# Patient Record
Sex: Male | Born: 1995 | Hispanic: Yes | Marital: Single | Smoking: Current every day smoker
Health system: Southern US, Community
[De-identification: ages and names within clinical notes are randomized; demographics above are authoritative.]

---
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
## 2013-05-20 ENCOUNTER — Encounter (HOSPITAL_COMMUNITY): Payer: Self-pay | Admitting: Emergency Medicine

## 2013-05-20 ENCOUNTER — Emergency Department (HOSPITAL_COMMUNITY)
Admission: EM | Admit: 2013-05-20 | Discharge: 2013-05-20 | Disposition: A | Discharge status: Home / Self Care | Payer: Self-pay | Attending: Emergency Medicine | Admitting: Emergency Medicine

## 2013-05-20 DIAGNOSIS — J029 Acute pharyngitis, unspecified: Secondary | ICD-10-CM

## 2013-05-20 DIAGNOSIS — J209 Acute bronchitis, unspecified: Secondary | ICD-10-CM | POA: Insufficient documentation

## 2013-05-20 DIAGNOSIS — H9209 Otalgia, unspecified ear: Secondary | ICD-10-CM | POA: Insufficient documentation

## 2013-05-20 DIAGNOSIS — J40 Bronchitis, not specified as acute or chronic: Secondary | ICD-10-CM

## 2013-05-20 DIAGNOSIS — R197 Diarrhea, unspecified: Secondary | ICD-10-CM | POA: Insufficient documentation

## 2013-05-20 DIAGNOSIS — R1110 Vomiting, unspecified: Secondary | ICD-10-CM | POA: Insufficient documentation

## 2013-05-20 DIAGNOSIS — F17200 Nicotine dependence, unspecified, uncomplicated: Secondary | ICD-10-CM | POA: Insufficient documentation

## 2013-05-20 MED ORDER — AMOXICILLIN 500 MG PO CAPS: 500.0000 mg | ORAL_CAPSULE | Freq: Three times a day (TID) | ORAL | Status: DC

## 2013-05-20 NOTE — ED Notes (Signed)
Pt alert, arrives from home, c/o sore throat, fever, onset was several days ago, resp even unlabored, skin pwd

## 2013-05-20 NOTE — ED Provider Notes (Signed)
CSN: [id number]     Arrival date & time 05/20/13  [id number] History   First MD Initiated Contact with Patient 05/20/13 [phone]     Chief Complaint  Patient presents with  . Sore Throat     (Consider location/radiation/quality/duration/timing/severity/associated sxs/prior Treatment) HPI Patient reports 5 days ago he started having fever and body aches. The following day he started having a sore throat that has persisted. He also reports he is having green rhinorrhea and coughing with green mucus production. He also has pain in his right ear. He reports he had vomiting twice the first day. He had mild diarrhea yesterday. He denies nausea now. He denies feeling dizzy, lightheaded, or weak. His wife reports the whole household has been ill.   PCP none  History reviewed. No pertinent past medical history. History reviewed. No pertinent past surgical history. History reviewed. No pertinent family history. History  Substance Use Topics  . Smoking status: Current Every Day Smoker    Types: Cigarettes  . Smokeless tobacco: Not on file  . 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      Allergies  Review of patient's allergies indicates no known allergies.  Home Medications  none  BP 141/60  Pulse 83  Temp(Src) 98 F (36.7 C) (Oral)  Resp 16  Wt 280 lb (127.007 kg)  SpO2 98%  Vital signs normal   Physical Exam  Nursing note and vitals reviewed. Constitutional: He is oriented to person, place, and time. He appears well-developed and well-nourished.  Non-toxic appearance. He does not appear ill. No distress.  HENT:  Head: Normocephalic and atraumatic.  Right Ear: Hearing, tympanic membrane, external ear and ear canal normal.  Left Ear: Hearing, tympanic membrane, external ear and ear canal normal.  Nose: Nose normal. No mucosal edema or rhinorrhea.  Mouth/Throat: Oropharynx is  clear and moist and mucous membranes are normal. No dental abscesses or uvula swelling.  Eyes: Conjunctivae and EOM are normal. Pupils are equal, round, and reactive to light.  Neck: Normal range of motion and full passive range of motion without pain. Neck supple.  Cardiovascular: Normal rate, regular rhythm and normal heart sounds.  Exam reveals no gallop and no friction rub.   No murmur heard. Pulmonary/Chest: Effort normal and breath sounds normal. No respiratory distress. He has no wheezes. He has no rhonchi. He has no rales. He exhibits no tenderness and no crepitus.  Abdominal: Soft. Normal appearance and bowel sounds are normal. He exhibits no distension. There is no tenderness. There is no rebound and no guarding.  Musculoskeletal: Normal range of motion. He exhibits no edema and no tenderness.  Moves all extremities well.   Neurological: He is alert and oriented to person, place, and time. He has normal strength. No cranial nerve deficit.  Skin: Skin is warm, dry and intact. No rash noted. No erythema. No pallor.  Psychiatric: He has a normal mood and affect. His speech is normal and behavior is normal. His mood appears not anxious.    ED Course  Procedures (including critical care time)    Labs Review Labs Reviewed - No data to display Imaging Review No results found.   EKG Interpretation None      MDM   patient presents with URI symptoms with sore throat for almost a week. He is a smoker, he was treated    Final diagnoses:  Bronchitis  Pharyngitis    Discharge Medication List as of 05/20/2013  9:59 AM    START taking these medications   Details  amoxicillin (AMOXIL) 500 MG capsule Take 1 capsule (500 mg total) by mouth 3 (three) times daily., Starting 05/20/2013, Until Discontinued, Print        Plan discharge   Devoria Albe, MD, Franz Dell, MD 05/20/13 1139

## 2013-05-20 NOTE — Discharge Instructions (Signed)
Drink plenty of fluids. Take ibuprofen 600 mg + acetaminophen 1000 mg every 6 hrs for pain/fever. Take the antibiotic until gone. You can take mucinex-DM OTC for cough. Recheck if you feel worse.

## 2013-05-20 NOTE — Progress Notes (Signed)
[facility] provided pt with a list of primary care resources to help patient establish primary care.  °

## 2015-04-06 ENCOUNTER — Encounter (HOSPITAL_COMMUNITY): Payer: Self-pay | Admitting: *Deleted

## 2015-04-06 ENCOUNTER — Emergency Department (HOSPITAL_COMMUNITY)
Admission: EM | Admit: 2015-04-06 | Discharge: 2015-04-06 | Disposition: A | Discharge status: Home / Self Care | Payer: Self-pay | Attending: Emergency Medicine | Admitting: Emergency Medicine

## 2015-04-06 DIAGNOSIS — R300 Dysuria: Secondary | ICD-10-CM | POA: Insufficient documentation

## 2015-04-06 DIAGNOSIS — S39012A Strain of muscle, fascia and tendon of lower back, initial encounter: Secondary | ICD-10-CM | POA: Insufficient documentation

## 2015-04-06 DIAGNOSIS — F17210 Nicotine dependence, cigarettes, uncomplicated: Secondary | ICD-10-CM | POA: Insufficient documentation

## 2015-04-06 DIAGNOSIS — Y9289 Other specified places as the place of occurrence of the external cause: Secondary | ICD-10-CM | POA: Insufficient documentation

## 2015-04-06 DIAGNOSIS — X58XXXA Exposure to other specified factors, initial encounter: Secondary | ICD-10-CM | POA: Insufficient documentation

## 2015-04-06 DIAGNOSIS — Y9389 Activity, other specified: Secondary | ICD-10-CM | POA: Insufficient documentation

## 2015-04-06 DIAGNOSIS — Y990 Civilian activity done for income or pay: Secondary | ICD-10-CM | POA: Insufficient documentation

## 2015-04-06 LAB — URINALYSIS, ROUTINE W REFLEX MICROSCOPIC
BILIRUBIN URINE: NEGATIVE
GLUCOSE, UA: NEGATIVE mg/dL
HGB URINE DIPSTICK: NEGATIVE
KETONES UR: NEGATIVE mg/dL
Leukocytes, UA: NEGATIVE
Nitrite: NEGATIVE
PH: 6 (ref 5.0–8.0)
Protein, ur: NEGATIVE mg/dL
SPECIFIC GRAVITY, URINE: 1.018 (ref 1.005–1.030)

## 2015-04-06 MED ORDER — IBUPROFEN 600 MG PO TABS: 600.0000 mg | ORAL_TABLET | Freq: Four times a day (QID) | ORAL | Status: AC | PRN

## 2015-04-06 MED ORDER — CYCLOBENZAPRINE HCL 10 MG PO TABS: 10.0000 mg | ORAL_TABLET | Freq: Two times a day (BID) | ORAL | Status: AC | PRN

## 2015-04-06 MED ORDER — OXYCODONE-ACETAMINOPHEN 5-325 MG PO TABS: 2.0000 | ORAL_TABLET | ORAL | Status: AC | PRN

## 2015-04-06 NOTE — ED Notes (Signed)
Pt reports bila testicular pain, low back pain and dysuria.

## 2015-04-06 NOTE — ED Provider Notes (Signed)
CSN: [id number]     Arrival date & time 04/06/15  0846 History   First MD Initiated Contact with Patient 04/06/15 [phone]     Chief Complaint  Patient presents with  . Groin Pain  . 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  He's also had dysuria.  Pain on the left side radiates into the left groin area.  No vomiting.  Pain seems to be worse when he moves about.  Denies fever or chills.  No previous history of kidney stones. History reviewed. No pertinent past medical history. History reviewed. No pertinent past surgical history. No family history on file. Social History  Substance Use Topics  . Smoking status: Current Every Day Smoker    Types: Cigarettes  . Smokeless tobacco: None  . Alcohol Use: No    Review of Systems  Genitourinary: Positive for dysuria. Negative for hematuria, discharge and penile pain.  All other systems reviewed and are negative.     Allergies  Review of patient's allergies indicates no known allergies.  Home Medications   Prior to Admission medications   Medication Sig Start Date End Date Taking? Authorizing Provider  cyclobenzaprine (FLEXERIL) 10 MG tablet Take 1 tablet (10 mg total) by mouth 2 (two) times daily as needed for muscle spasms. 04/06/15   Nelva Nay, MD  ibuprofen (ADVIL,MOTRIN) 600 MG tablet Take 1 tablet (600 mg total) by mouth every 6 (six) hours as needed. 04/06/15   Nelva Nay, MD  oxyCODONE-acetaminophen (PERCOCET/ROXICET) 5-325 MG tablet Take 2 tablets by mouth every 4 (four) hours as needed for severe pain. 04/06/15   Nelva Nay, MD   BP 150/86 mmHg  Pulse 101  Temp(Src) 97.6 F (36.4 C) (Oral)  Resp 18  SpO2 100% Physical Exam  Constitutional: He is oriented to person, place, and time. He appears well-developed and well-nourished. No distress.  HENT:  Head: Normocephalic and atraumatic.  Eyes: Pupils are equal, round, and reactive to  light.  Neck: Normal range of motion.  Cardiovascular: Normal rate and intact distal pulses.   Pulmonary/Chest: No respiratory distress.  Abdominal: Normal appearance. He exhibits no distension. There is no tenderness. There is no rebound. Hernia confirmed negative in the left inguinal area.  Genitourinary: Penis normal. Right testis shows no mass and no swelling. Left testis shows no mass and no swelling.  Musculoskeletal: Normal range of motion.  Neurological: He is alert and oriented to person, place, and time. No cranial nerve deficit.  Skin: Skin is warm and dry. No rash noted.  Psychiatric: He has a normal mood and affect. His behavior is normal.  Nursing note and vitals reviewed.   ED Course  Procedures (including critical care time) Labs Review Labs Reviewed  URINALYSIS, ROUTINE W REFLEX MICROSCOPIC (NOT AT [facility])    .    MDM   Final diagnoses:  Back strain, initial encounter        Nelva Nay, MD 04/06/15 1006

## 2015-04-06 NOTE — Discharge Instructions (Signed)
Muscle Strain A muscle strain (pulled muscle) happens when a muscle is stretched beyond normal length. It happens when a sudden, violent force stretches your muscle too far. Usually, a few of the fibers in your muscle are torn. Muscle strain is common in athletes. Recovery usually takes 1-2 weeks. Complete healing takes 5-6 weeks.  HOME CARE   Follow the PRICE method of treatment to help your injury get better. Do this the first 2-3 days after the injury:  Protect. Protect the muscle to keep it from getting injured again.  Rest. Limit your activity and rest the injured body part.  Ice. Put ice in a plastic bag. Place a towel between your skin and the bag. Then, apply the ice and leave it on from 15-20 minutes each hour. After the third day, switch to moist heat packs.  Compression. Use a splint or elastic bandage on the injured area for comfort. Do not put it on too tightly.  Elevate. Keep the injured body part above the level of your heart.  Only take medicine as told by your doctor.  Warm up before doing exercise to prevent future muscle strains. GET HELP IF:   You have more pain or puffiness (swelling) in the injured area.  You feel numbness, tingling, or notice a loss of strength in the injured area. MAKE SURE YOU:   Understand these instructions.  Will watch your condition.  Will get help right away if you are not doing well or get worse.   This information is not intended to replace advice given to you by your health care provider. Make sure you discuss any questions you have with your health care provider.   Document Released: 12/06/2007 Document Revised: 12/17/2012 Document Reviewed: 09/25/2012 Elsevier Interactive Patient Education 2016 [employer].  Low Back Strain With Rehab A strain is an injury in which a tendon or muscle is torn. The muscles and tendons of the lower back are vulnerable to strains. However, these muscles and tendons are very strong and require a  great force to be injured. Strains are classified into three categories. Grade 1 strains cause pain, but the tendon is not lengthened. Grade 2 strains include a lengthened ligament, due to the ligament being stretched or partially ruptured. With grade 2 strains there is still function, although the function may be decreased. Grade 3 strains involve a complete tear of the tendon or muscle, and function is usually impaired. SYMPTOMS   Pain in the lower back.  Pain that affects one side more than the other.  Pain that gets worse with movement and may be felt in the hip, buttocks, or back of the thigh.  Muscle spasms of the muscles in the back.  Swelling along the muscles of the back.  Loss of strength of the back muscles.  Crackling sound (crepitation) when the muscles are touched. CAUSES  Lower back strains occur when a force is placed on the muscles or tendons that is greater than they can handle. Common causes of injury include:  Prolonged overuse of the muscle-tendon units in the lower back, usually from incorrect posture.  A single violent injury or force applied to the back. RISK INCREASES WITH:  Sports that involve twisting forces on the spine or a lot of bending at the waist (football, rugby, weightlifting, bowling, golf, tennis, speed skating, racquetball, swimming, running, gymnastics, diving).  Poor strength and flexibility.  Failure to warm up properly before activity.  Family history of lower back pain or disk disorders.  Previous back injury or surgery (especially fusion).  Poor posture with lifting, especially heavy objects.  Prolonged sitting, especially with poor posture. PREVENTION   Learn and use proper posture when sitting or lifting (maintain proper posture when sitting, lift using the knees and legs, not at the waist).  Warm up and stretch properly before activity.  Allow for adequate recovery between workouts.  Maintain physical fitness:  Strength,  flexibility, and endurance.  Cardiovascular fitness. PROGNOSIS  If treated properly, lower back strains usually heal within 6 weeks. RELATED COMPLICATIONS   Recurring symptoms, resulting in a chronic problem.  Chronic inflammation, scarring, and partial muscle-tendon tear.  Delayed healing or resolution of symptoms.  Prolonged disability. TREATMENT  Treatment first involves the use of ice and medicine, to reduce pain and inflammation. The use of strengthening and stretching exercises may help reduce pain with activity. These exercises may be performed at home or with a therapist. Severe injuries may require referral to a therapist for further evaluation and treatment, such as ultrasound. Your caregiver may advise that you wear a back brace or corset, to help reduce pain and discomfort. Often, prolonged bed rest results in greater harm then benefit. Corticosteroid injections may be recommended. However, these should be reserved for the most serious cases. It is important to avoid using your back when lifting objects. At night, sleep on your back on a firm mattress with a pillow placed under your knees. If non-surgical treatment is unsuccessful, surgery may be needed.  MEDICATION   If pain medicine is needed, nonsteroidal anti-inflammatory medicines (aspirin and ibuprofen), or other minor pain relievers (acetaminophen), are often advised.  Do not take pain medicine for 7 days before surgery.  Prescription pain relievers may be given, if your caregiver thinks they are needed. Use only as directed and only as much as you need.  Ointments applied to the skin may be helpful.  Corticosteroid injections may be given by your caregiver. These injections should be reserved for the most serious cases, because they may only be given a certain number of times. HEAT AND COLD  Cold treatment (icing) should be applied for 10 to 15 minutes every 2 to 3 hours for inflammation and pain, and immediately  after activity that aggravates your symptoms. Use ice packs or an ice massage.  Heat treatment may be used before performing stretching and strengthening activities prescribed by your caregiver, physical therapist, or athletic trainer. Use a heat pack or a warm water soak. SEEK MEDICAL CARE IF:   Symptoms get worse or do not improve in 2 to 4 weeks, despite treatment.  You develop numbness, weakness, or loss of bowel or bladder function.  New, unexplained symptoms develop. (Drugs used in treatment may produce side effects.) EXERCISES  RANGE OF MOTION (ROM) AND STRETCHING EXERCISES - Low Back Strain Most people with lower back pain will find that their symptoms get worse with excessive bending forward (flexion) or arching at the lower back (extension). The exercises which will help resolve your symptoms will focus on the opposite motion.  Your physician, physical therapist or athletic trainer will help you determine which exercises will be most helpful to resolve your lower back pain. Do not complete any exercises without first consulting with your caregiver. Discontinue any exercises which make your symptoms worse until you speak to your caregiver.  If you have pain, numbness or tingling which travels down into your buttocks, leg or foot, the goal of the therapy is for these symptoms to move closer to  your back and eventually resolve. Sometimes, these leg symptoms will get better, but your lower back pain may worsen. This is typically an indication of progress in your rehabilitation. Be very alert to any changes in your symptoms and the activities in which you participated in the 24 hours prior to the change. Sharing this information with your caregiver will allow him/her to most efficiently treat your condition.  These exercises may help you when beginning to rehabilitate your injury. Your symptoms may resolve with or without further involvement from your physician, physical therapist or athletic  trainer. While completing these exercises, remember:  Restoring tissue flexibility helps normal motion to return to the joints. This allows healthier, less painful movement and activity.  An effective stretch should be held for at least 30 seconds.  A stretch should never be painful. You should only feel a gentle lengthening or release in the stretched tissue. FLEXION RANGE OF MOTION AND STRETCHING EXERCISES: STRETCH - Flexion, Single Knee to Chest   Lie on a firm bed or floor with both legs extended in front of you.  Keeping one leg in contact with the floor, bring your opposite knee to your chest. Hold your leg in place by either grabbing behind your thigh or at your knee.  Pull until you feel a gentle stretch in your lower back. Hold __________ seconds.  Slowly release your grasp and repeat the exercise with the opposite side. Repeat __________ times. Complete this exercise __________ times per day.  STRETCH - Flexion, Double Knee to Chest   Lie on a firm bed or floor with both legs extended in front of you.  Keeping one leg in contact with the floor, bring your opposite knee to your chest.  Tense your stomach muscles to support your back and then lift your other knee to your chest. Hold your legs in place by either grabbing behind your thighs or at your knees.  Pull both knees toward your chest until you feel a gentle stretch in your lower back. Hold __________ seconds.  Tense your stomach muscles and slowly return one leg at a time to the floor. Repeat __________ times. Complete this exercise __________ times per day.  STRETCH - Low Trunk Rotation  Lie on a firm bed or floor. Keeping your legs in front of you, bend your knees so they are both pointed toward the ceiling and your feet are flat on the floor.  Extend your arms out to the side. This will stabilize your upper body by keeping your shoulders in contact with the floor.  Gently and slowly drop both knees together to  one side until you feel a gentle stretch in your lower back. Hold for __________ seconds.  Tense your stomach muscles to support your lower back as you bring your knees back to the starting position. Repeat the exercise to the other side. Repeat __________ times. Complete this exercise __________ times per day  EXTENSION RANGE OF MOTION AND FLEXIBILITY EXERCISES: STRETCH - Extension, Prone on Elbows   Lie on your stomach on the floor, a bed will be too soft. Place your palms about shoulder width apart and at the height of your head.  Place your elbows under your shoulders. If this is too painful, stack pillows under your chest.  Allow your body to relax so that your hips drop lower and make contact more completely with the floor.  Hold this position for __________ seconds.  Slowly return to lying flat on the floor. Repeat __________ times.  Complete this exercise __________ times per day.  RANGE OF MOTION - Extension, Prone Press Ups  Lie on your stomach on the floor, a bed will be too soft. Place your palms about shoulder width apart and at the height of your head.  Keeping your back as relaxed as possible, slowly straighten your elbows while keeping your hips on the floor. You may adjust the placement of your hands to maximize your comfort. As you gain motion, your hands will come more underneath your shoulders.  Hold this position __________ seconds.  Slowly return to lying flat on the floor. Repeat __________ times. Complete this exercise __________ times per day.  RANGE OF MOTION- Quadruped, Neutral Spine   Assume a hands and knees position on a firm surface. Keep your hands under your shoulders and your knees under your hips. You may place padding under your knees for comfort.  Drop your head and point your tail bone toward the ground below you. This will round out your lower back like an angry cat. Hold this position for __________ seconds.  Slowly lift your head and release  your tail bone so that your back sags into a large arch, like an old horse.  Hold this position for __________ seconds.  Repeat this until you feel limber in your lower back.  Now, find your "sweet spot." This will be the most comfortable position somewhere between the two previous positions. This is your neutral spine. Once you have found this position, tense your stomach muscles to support your lower back.  Hold this position for __________ seconds. Repeat __________ times. Complete this exercise __________ times per day.  STRENGTHENING EXERCISES - Low Back Strain These exercises may help you when beginning to rehabilitate your injury. These exercises should be done near your "sweet spot." This is the neutral, low-back arch, somewhere between fully rounded and fully arched, that is your least painful position. When performed in this safe range of motion, these exercises can be used for people who have either a flexion or extension based injury. These exercises may resolve your symptoms with or without further involvement from your physician, physical therapist or athletic trainer. While completing these exercises, remember:   Muscles can gain both the endurance and the strength needed for everyday activities through controlled exercises.  Complete these exercises as instructed by your physician, physical therapist or athletic trainer. Increase the resistance and repetitions only as guided.  You may experience muscle soreness or fatigue, but the pain or discomfort you are trying to eliminate should never worsen during these exercises. If this pain does worsen, stop and make certain you are following the directions exactly. If the pain is still present after adjustments, discontinue the exercise until you can discuss the trouble with your caregiver. STRENGTHENING - Deep Abdominals, Pelvic Tilt  Lie on a firm bed or floor. Keeping your legs in front of you, bend your knees so they are both pointed  toward the ceiling and your feet are flat on the floor.  Tense your lower abdominal muscles to press your lower back into the floor. This motion will rotate your pelvis so that your tail bone is scooping upwards rather than pointing at your feet or into the floor.  With a gentle tension and even breathing, hold this position for __________ seconds. Repeat __________ times. Complete this exercise __________ times per day.  STRENGTHENING - Abdominals, Crunches   Lie on a firm bed or floor. Keeping your legs in front of you, bend your  knees so they are both pointed toward the ceiling and your feet are flat on the floor. Cross your arms over your chest.  Slightly tip your chin down without bending your neck.  Tense your abdominals and slowly lift your trunk high enough to just clear your shoulder blades. Lifting higher can put excessive stress on the lower back and does not further strengthen your abdominal muscles.  Control your return to the starting position. Repeat __________ times. Complete this exercise __________ times per day.  STRENGTHENING - Quadruped, Opposite UE/LE Lift   Assume a hands and knees position on a firm surface. Keep your hands under your shoulders and your knees under your hips. You may place padding under your knees for comfort.  Find your neutral spine and gently tense your abdominal muscles so that you can maintain this position. Your shoulders and hips should form a rectangle that is parallel with the floor and is not twisted.  Keeping your trunk steady, lift your right hand no higher than your shoulder and then your left leg no higher than your hip. Make sure you are not holding your breath. Hold this position __________ seconds.  Continuing to keep your abdominal muscles tense and your back steady, slowly return to your starting position. Repeat with the opposite arm and leg. Repeat __________ times. Complete this exercise __________ times per day.  STRENGTHENING  - Lower Abdominals, Double Knee Lift  Lie on a firm bed or floor. Keeping your legs in front of you, bend your knees so they are both pointed toward the ceiling and your feet are flat on the floor.  Tense your abdominal muscles to brace your lower back and slowly lift both of your knees until they come over your hips. Be certain not to hold your breath.  Hold __________ seconds. Using your abdominal muscles, return to the starting position in a slow and controlled manner. Repeat __________ times. Complete this exercise __________ times per day.  POSTURE AND BODY MECHANICS CONSIDERATIONS - Low Back Strain Keeping correct posture when sitting, standing or completing your activities will reduce the stress put on different body tissues, allowing injured tissues a chance to heal and limiting painful experiences. The following are general guidelines for improved posture. Your physician or physical therapist will provide you with any instructions specific to your needs. While reading these guidelines, remember:  The exercises prescribed by your provider will help you have the flexibility and strength to maintain correct postures.  The correct posture provides the best environment for your joints to work. All of your joints have less wear and tear when properly supported by a spine with good posture. This means you will experience a healthier, less painful body.  Correct posture must be practiced with all of your activities, especially prolonged sitting and standing. Correct posture is as important when doing repetitive low-stress activities (typing) as it is when doing a single heavy-load activity (lifting). RESTING POSITIONS Consider which positions are most painful for you when choosing a resting position. If you have pain with flexion-based activities (sitting, bending, stooping, squatting), choose a position that allows you to rest in a less flexed posture. You would want to avoid curling into a fetal  position on your side. If your pain worsens with extension-based activities (prolonged standing, working overhead), avoid resting in an extended position such as sleeping on your stomach. Most people will find more comfort when they rest with their spine in a more neutral position, neither too rounded nor too arched.  Lying on a non-sagging bed on your side with a pillow between your knees, or on your back with a pillow under your knees will often provide some relief. Keep in mind, being in any one position for a prolonged period of time, no matter how correct your posture, can still lead to stiffness. PROPER SITTING POSTURE In order to minimize stress and discomfort on your spine, you must sit with correct posture. Sitting with good posture should be effortless for a healthy body. Returning to good posture is a gradual process. Many people can work toward this most comfortably by using various supports until they have the flexibility and strength to maintain this posture on their own. When sitting with proper posture, your ears will fall over your shoulders and your shoulders will fall over your hips. You should use the back of the chair to support your upper back. Your lower back will be in a neutral position, just slightly arched. You may place a small pillow or folded towel at the base of your lower back for support.  When working at a desk, create an environment that supports good, upright posture. Without extra support, muscles tire, which leads to excessive strain on joints and other tissues. Keep these recommendations in mind: CHAIR:  A chair should be able to slide under your desk when your back makes contact with the back of the chair. This allows you to work closely.  The chair's height should allow your eyes to be level with the upper part of your monitor and your hands to be slightly lower than your elbows. BODY POSITION  Your feet should make contact with the floor. If this is not possible,  use a foot rest.  Keep your ears over your shoulders. This will reduce stress on your neck and lower back. INCORRECT SITTING POSTURES  If you are feeling tired and unable to assume a healthy sitting posture, do not slouch or slump. This puts excessive strain on your back tissues, causing more damage and pain. Healthier options include:  Using more support, like a lumbar pillow.  Switching tasks to something that requires you to be upright or walking.  Talking a brief walk.  Lying down to rest in a neutral-spine position. PROLONGED STANDING WHILE SLIGHTLY LEANING FORWARD  When completing a task that requires you to lean forward while standing in one place for a long time, place either foot up on a stationary 2-4 inch high object to help maintain the best posture. When both feet are on the ground, the lower back tends to lose its slight inward curve. If this curve flattens (or becomes too large), then the back and your other joints will experience too much stress, tire more quickly, and can cause pain. CORRECT STANDING POSTURES Proper standing posture should be assumed with all daily activities, even if they only take a few moments, like when brushing your teeth. As in sitting, your ears should fall over your shoulders and your shoulders should fall over your hips. You should keep a slight tension in your abdominal muscles to brace your spine. Your tailbone should point down to the ground, not behind your body, resulting in an over-extended swayback posture.  INCORRECT STANDING POSTURES  Common incorrect standing postures include a forward head, locked knees and/or an excessive swayback. WALKING Walk with an upright posture. Your ears, shoulders and hips should all line-up. PROLONGED ACTIVITY IN A FLEXED POSITION When completing a task that requires you to bend forward at your waist or lean  over a low surface, try to find a way to stabilize 3 out of 4 of your limbs. You can place a hand or elbow  on your thigh or rest a knee on the surface you are reaching across. This will provide you more stability so that your muscles do not fatigue as quickly. By keeping your knees relaxed, or slightly bent, you will also reduce stress across your lower back. CORRECT LIFTING TECHNIQUES DO :   Assume a wide stance. This will provide you more stability and the opportunity to get as close as possible to the object which you are lifting.  Tense your abdominals to brace your spine. Bend at the knees and hips. Keeping your back locked in a neutral-spine position, lift using your leg muscles. Lift with your legs, keeping your back straight.  Test the weight of unknown objects before attempting to lift them.  Try to keep your elbows locked down at your sides in order get the best strength from your shoulders when carrying an object.  Always ask for help when lifting heavy or awkward objects. INCORRECT LIFTING TECHNIQUES DO NOT:   Lock your knees when lifting, even if it is a small object.  Bend and twist. Pivot at your feet or move your feet when needing to change directions.  Assume that you can safely pick up even a paper clip without proper posture.   This information is not intended to replace advice given to you by your health care provider. Make sure you discuss any questions you have with your health care provider.   Document Released: 02/26/2005 Document Revised: 03/19/2014 Document Reviewed: 06/10/2008 Elsevier Interactive Patient Education [employer]

## 2015-04-06 NOTE — ED Notes (Signed)
Pt reports bila testicular pain, low back pain with dysuria x 2 days.  Pt reports pain is worse when picking up a heavy object or when ambulating
